# Patient Record
Sex: Female | Born: 1945 | Race: Black or African American | Hispanic: No
Health system: Southern US, Community
[De-identification: ages and names within clinical notes are randomized; demographics above are authoritative.]

---

## 2009-03-20 ENCOUNTER — Inpatient Hospital Stay (HOSPITAL_COMMUNITY): Admission: EM | Admit: 2009-03-20 | Discharge: 2009-03-25 | Payer: Self-pay | Admitting: Emergency Medicine

## 2009-03-23 ENCOUNTER — Encounter (INDEPENDENT_AMBULATORY_CARE_PROVIDER_SITE_OTHER): Payer: Self-pay | Admitting: Internal Medicine

## 2009-03-23 ENCOUNTER — Ambulatory Visit: Payer: Self-pay | Admitting: Physical Medicine & Rehabilitation

## 2009-03-25 ENCOUNTER — Ambulatory Visit: Payer: Self-pay | Admitting: Physical Medicine & Rehabilitation

## 2009-03-25 ENCOUNTER — Inpatient Hospital Stay (HOSPITAL_COMMUNITY)
Admission: RE | Admit: 2009-03-25 | Discharge: 2009-04-07 | Payer: Self-pay | Admitting: Physical Medicine & Rehabilitation

## 2009-05-19 ENCOUNTER — Encounter
Admission: RE | Admit: 2009-05-19 | Discharge: 2009-05-22 | Payer: Self-pay | Admitting: Physical Medicine & Rehabilitation

## 2009-05-22 ENCOUNTER — Ambulatory Visit: Payer: Self-pay | Admitting: Physical Medicine & Rehabilitation

## 2009-06-12 ENCOUNTER — Encounter
Admission: RE | Admit: 2009-06-12 | Discharge: 2009-07-31 | Payer: Self-pay | Admitting: Physical Medicine & Rehabilitation

## 2009-06-25 ENCOUNTER — Ambulatory Visit: Payer: Self-pay | Admitting: Internal Medicine

## 2009-06-25 DIAGNOSIS — I739 Peripheral vascular disease, unspecified: Secondary | ICD-10-CM | POA: Insufficient documentation

## 2009-06-25 DIAGNOSIS — I10 Essential (primary) hypertension: Secondary | ICD-10-CM | POA: Insufficient documentation

## 2009-06-25 DIAGNOSIS — F411 Generalized anxiety disorder: Secondary | ICD-10-CM | POA: Insufficient documentation

## 2009-06-25 DIAGNOSIS — Z8679 Personal history of other diseases of the circulatory system: Secondary | ICD-10-CM | POA: Insufficient documentation

## 2009-06-25 DIAGNOSIS — F329 Major depressive disorder, single episode, unspecified: Secondary | ICD-10-CM

## 2009-06-25 DIAGNOSIS — F068 Other specified mental disorders due to known physiological condition: Secondary | ICD-10-CM | POA: Insufficient documentation

## 2009-06-25 DIAGNOSIS — R5383 Other fatigue: Secondary | ICD-10-CM

## 2009-06-25 DIAGNOSIS — R5381 Other malaise: Secondary | ICD-10-CM

## 2009-06-25 DIAGNOSIS — F1021 Alcohol dependence, in remission: Secondary | ICD-10-CM

## 2009-06-25 DIAGNOSIS — E785 Hyperlipidemia, unspecified: Secondary | ICD-10-CM | POA: Insufficient documentation

## 2009-06-29 LAB — CONVERTED CEMR LAB
AST: 22 units/L (ref 0–37)
Albumin: 4 g/dL (ref 3.5–5.2)
Basophils Absolute: 0.1 10*3/uL (ref 0.0–0.1)
Bilirubin Urine: NEGATIVE
Cholesterol: 209 mg/dL — ABNORMAL HIGH (ref 0–200)
Creatinine, Ser: 0.7 mg/dL (ref 0.4–1.2)
Eosinophils Absolute: 0.1 10*3/uL (ref 0.0–0.7)
Folate: 5.6 ng/mL
GFR calc non Af Amer: 106.43 mL/min (ref >60)
HCT: 38.4 % (ref 36.0–46.0)
Hemoglobin: 12.9 g/dL (ref 12.0–15.0)
Iron: 60 ug/dL (ref 42–145)
Ketones, ur: NEGATIVE mg/dL
Lymphs Abs: 1.3 10*3/uL (ref 0.7–4.0)
Neutrophils Relative %: 60.1 % (ref 43.0–77.0)
Platelets: 282 10*3/uL (ref 150.0–400.0)
Potassium: 4 meq/L (ref 3.5–5.1)
RBC: 4.27 M/uL (ref 3.87–5.11)
RDW: 13.7 % (ref 11.5–14.6)
Saturation Ratios: 19.6 % — ABNORMAL LOW (ref 20.0–50.0)
Sodium: 140 meq/L (ref 135–145)
Total Bilirubin: 0.8 mg/dL (ref 0.3–1.2)
Total CHOL/HDL Ratio: 3
Transferrin: 218.2 mg/dL (ref 212.0–360.0)
Triglycerides: 51 mg/dL (ref 0.0–149.0)
Urine Glucose: NEGATIVE mg/dL
Urobilinogen, UA: 2 (ref 0.0–1.0)
WBC: 4.4 10*3/uL — ABNORMAL LOW (ref 4.5–10.5)

## 2009-07-07 ENCOUNTER — Emergency Department (HOSPITAL_COMMUNITY): Admission: EM | Admit: 2009-07-07 | Discharge: 2009-07-07 | Payer: Self-pay | Admitting: Emergency Medicine

## 2009-07-22 ENCOUNTER — Encounter
Admission: RE | Admit: 2009-07-22 | Discharge: 2009-07-28 | Payer: Self-pay | Admitting: Physical Medicine & Rehabilitation

## 2009-07-27 ENCOUNTER — Telehealth: Payer: Self-pay | Admitting: Internal Medicine

## 2009-07-28 ENCOUNTER — Ambulatory Visit: Payer: Self-pay | Admitting: Physical Medicine & Rehabilitation

## 2009-07-29 ENCOUNTER — Telehealth: Payer: Self-pay | Admitting: Internal Medicine

## 2009-07-29 ENCOUNTER — Ambulatory Visit: Payer: Self-pay | Admitting: Internal Medicine

## 2009-07-30 ENCOUNTER — Encounter: Payer: Self-pay | Admitting: Internal Medicine

## 2009-08-07 ENCOUNTER — Encounter: Payer: Self-pay | Admitting: Internal Medicine

## 2009-08-13 ENCOUNTER — Encounter: Payer: Self-pay | Admitting: Internal Medicine

## 2009-08-18 ENCOUNTER — Ambulatory Visit: Payer: Self-pay | Admitting: Internal Medicine

## 2009-09-17 ENCOUNTER — Encounter: Payer: Self-pay | Admitting: Internal Medicine

## 2009-10-10 ENCOUNTER — Emergency Department (HOSPITAL_COMMUNITY): Admission: EM | Admit: 2009-10-10 | Discharge: 2009-10-10 | Payer: Self-pay | Admitting: Emergency Medicine

## 2009-10-13 ENCOUNTER — Encounter: Payer: Self-pay | Admitting: Internal Medicine

## 2009-10-16 ENCOUNTER — Encounter
Admission: RE | Admit: 2009-10-16 | Discharge: 2009-10-16 | Payer: Self-pay | Source: Home / Self Care | Attending: Physical Medicine & Rehabilitation | Admitting: Physical Medicine & Rehabilitation

## 2009-10-23 ENCOUNTER — Ambulatory Visit (HOSPITAL_COMMUNITY): Admission: RE | Admit: 2009-10-23 | Discharge: 2009-10-23 | Payer: Self-pay | Admitting: Family Medicine

## 2010-02-28 ENCOUNTER — Encounter: Payer: Self-pay | Admitting: Physical Medicine & Rehabilitation

## 2010-03-09 NOTE — Miscellaneous (Signed)
Summary: Face to face encounter/[facility]  Face to face encounter/[facility]   Imported By: Sherian Rein 10/29/2009 09:44:07  _____________________________________________________________________  External Attachment:    Type:   Image     Comment:   External Document

## 2010-03-09 NOTE — Miscellaneous (Signed)
Summary: Orders / [facility]  Orders / [facility]   Imported By: Lennie Odor 08/19/2009 16:49:13  _____________________________________________________________________  External Attachment:    Type:   Image     Comment:   External Document

## 2010-03-09 NOTE — Miscellaneous (Signed)
Summary: Plan of Care & Treatment/[facility]  Plan of Care & Treatment/[facility]   Imported By: Sherian Rein 08/19/2009 14:44:55  _____________________________________________________________________  External Attachment:    Type:   Image     Comment:   External Document

## 2010-03-09 NOTE — Progress Notes (Signed)
Summary: Referral/ RX  Phone Note Call from Patient   Caller: Daughter Meriam Sprague [phone] Summary of Call: Pt's daughter called requesting a referralto have HH aid come to pt's home and assist pt with daily activities and care. Pt is also requesting Rx for Ensure supplements. Initial call taken by: Margaret Pyle, CMA,  July 27, 2009 9:46 AM  Follow-up for Phone Call        ok for [facility] - done per EMR  ok for ensure - three times a day between meals  done hardcopy to LIM side B - dahlia  Follow-up by: Corwin Levins MD,  July 27, 2009 12:36 PM  Additional Follow-up for Phone Call Additional follow up Details #1::        pt's daughter informed and Rx faxed to pharmacy per daughter request. Daughter will expect call from [facility] for [facility] referral Additional Follow-up by: Margaret Pyle, CMA,  July 27, 2009 1:31 PM    New/Updated Medications: ENSURE  LIQD (NUTRITIONAL SUPPLEMENTS) 1 can by mouth three times a day between meals Prescriptions: ENSURE  LIQD (NUTRITIONAL SUPPLEMENTS) 1 can by mouth three times a day between meals  #90 x 5   Entered and Authorized by:   Corwin Levins MD   Signed by:   Corwin Levins MD on 07/27/2009   Method used:   Print then Give to Patient   RxID:   [id number]

## 2010-03-09 NOTE — Miscellaneous (Signed)
Summary: Orders/[facility]  Orders/[facility]   Imported By: Lester Warrenton 08/13/2009 08:25:17  _____________________________________________________________________  External Attachment:    Type:   Image     Comment:   External Document

## 2010-03-09 NOTE — Letter (Signed)
Summary: Generic Letter  [facility]  [address] [place], [place] [address]   Phone: [phone]  Fax: [phone]    07/29/2009  Traci Tucker [address] [place], [place]  [address] DOB  05-15-1945  Dear [name],       This letter is in support of your [facility] bus  system application.   You have been diagnosed with:  Stroke, Dementia, and Peripheral Vascular Disease,  which are each moderate and permanent, and together   fully prevent the use of [place] regular bus system under   all circumstances, due to being unable to personally  perform the mental and physical requirements needed.      Sincerely,   Oliver Barre MD

## 2010-03-09 NOTE — Progress Notes (Signed)
  Phone Note From Other Clinic   Caller: [facility] Summary of Call: [facility] stating pts. oxygen 87, coughed up phlem, oxygen at 96.Irving Burton at [facility] requesting DME for 3 in 1 potty chair and raised toilet.  Call back number [phone]. Initial call taken by: Robin Ewing CMA Duncan Dull),  July 29, 2009 8:59 AM  Follow-up for Phone Call        done hardcopy to LIM side B - dahlia  Follow-up by: Corwin Levins MD,  July 29, 2009 9:05 AM  Additional Follow-up for Phone Call Additional follow up Details #1::        Rx faxed to [facility] [phone] Additional Follow-up by: Margaret Pyle, CMA,  July 29, 2009 9:07 AM    New/Updated Medications: * 3 IN 1 COMMODE use asd Prescriptions: 3 IN 1 COMMODE use asd  #1 x 0   Entered and Authorized by:   Corwin Levins MD   Signed by:   Corwin Levins MD on 07/29/2009   Method used:   Print then Give to Patient   RxID:   [id number]

## 2010-03-09 NOTE — Letter (Signed)
Summary: [facility]  [facility]   Imported By: Sherian Rein 07/31/2009 09:51:32  _____________________________________________________________________  External Attachment:    Type:   Image     Comment:   External Document

## 2010-03-09 NOTE — Miscellaneous (Signed)
Summary: Orders/[facility]  Orders/[facility]   Imported By: Lester Williamston 09/18/2009 10:26:34  _____________________________________________________________________  External Attachment:    Type:   Image     Comment:   External Document

## 2010-03-09 NOTE — Letter (Signed)
Summary: CMN for [employer]/[facility]  CMN for [employer]/[facility]   Imported By: Sherian Rein 08/17/2009 13:48:31  _____________________________________________________________________  External Attachment:    Type:   Image     Comment:   External Document

## 2010-03-09 NOTE — Miscellaneous (Signed)
Summary: Face to Face Encounter/[facility]  Face to Face Encounter/[facility]   Imported By: Sherian Rein 08/19/2009 14:46:17  _____________________________________________________________________  External Attachment:    Type:   Image     Comment:   External Document

## 2010-03-09 NOTE — Letter (Signed)
Summary: Exam form/[facility]  Exam form/[facility]   Imported By: Sherian Rein 07/31/2009 09:58:28  _____________________________________________________________________  External Attachment:    Type:   Image     Comment:   External Document

## 2010-03-09 NOTE — Miscellaneous (Signed)
Summary: Face to Face Encounter / [facility]  Face to Face Encounter / [facility]   Imported By: Lennie Odor 08/26/2009 10:41:07  _____________________________________________________________________  External Attachment:    Type:   Image     Comment:   External Document

## 2010-03-09 NOTE — Assessment & Plan Note (Signed)
Summary: NEW MEDICARE PT  PKG # -PER DIANE /DR. KIRSTEINS- [facility]   Vital Signs:  Patient profile:   65 year old female Height:      62 inches Weight:      104 pounds BMI:     19.09 O2 Sat:      96 % on Room air Temp:     97.2 degrees F oral Pulse rate:   66 / minute BP sitting:   120 / 80  (left arm) Cuff size:   regular  Vitals Entered ByZella Ball Ewing (Jul 09, 2009 9:31 AM)  O2 Flow:  Room air  Preventive Care Screening     decliens colonoscopy or dxa  CC: New Pt. New Medicare/RE   CC:  New Pt. New Medicare/RE.  History of Present Illness: here s/p recent stroke; going to outpt rehab center currently for OT, to start PT , and speech therapy to start next wk;  overall doing well;  Pt denies CP, sob, doe, wheezing, orthopnea, pnd, worsening LE edema, palps, dizziness or syncope   Pt denies new neuro symptoms such as headache, facial or extremity weakness . Trying to follow low chol diet, good complaince with meds and tolerates well, including the statin.  Here for wellness Diet: Heart Healthy or DM if diabetic Physical Activities: Sedentary, can climb steps, no pain, and does not drive Depression/mood screen: mild to mod, no suicidal ideation Hearing: Intact bilateral Visual Acuity: Grossly normal, gets exam yearly - but due soon ADL's: Capable  - mostly independent with self care but requires some help; duaghter lives with her to help; walks without cane Fall Risk: mild Home Safety: Good - just got raised toilet Cognitive Impairment:  Gen appearance,, memory, attention & motor skills grossly intact except for the depressive demeanor, dysarthria, and motor defecit related to the recent stroke End-of-Life Planning: Advance directive - Full code/I agree  but no prolonged ventilation   Preventive Screening-Counseling & Management  Alcohol-Tobacco     Smoking Status: current      Drug Use:  no.    Problems Prior to Update: 1)  Fatigue  (ICD-780.79) 2)  Dementia   (ICD-294.8) 3)  Peripheral Vascular Disease  (ICD-443.9) 4)  Hypertension  (ICD-401.9) 5)  Hyperlipidemia  (ICD-272.4) 6)  Cerebrovascular Accident, Hx of  (ICD-V12.50) 7)  Anxiety  (ICD-300.00) 8)  Depression  (ICD-311) 9)  Alcohol Abuse, Hx of  (ICD-V11.3)  Medications Prior to Update: 1)  None  Current Medications (verified): 1)  Lisinopril 10 Mg Tabs (Lisinopril) .Marland Kitchen.. 1 By Mouth Once Daily 2)  Lovastatin 10 Mg Tabs (Lovastatin) .Marland Kitchen.. 1 By Mouth Once Daily 3)  Citalopram Hydrobromide 10 Mg Tabs (Citalopram Hydrobromide) .Marland Kitchen.. 1po Once Daily 4)  Ecotrin 325 Mg Tbec (Aspirin) .Marland Kitchen.. 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 stroke mother died with heart disease MI  Social History: Reviewed history and no changes required. 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  The patient denies fever, weight gain, vision loss, decreased hearing, hoarseness, chest pain, syncope, dyspnea on exertion, peripheral edema, prolonged cough, headaches, hemoptysis, abdominal pain, melena, hematochezia, severe indigestion/heartburn, hematuria, muscle weakness, suspicious skin lesions, transient blindness, abnormal bleeding, enlarged lymph nodes, angioedema, and breast masses.         all otherwise negative per pt -  excpet for mild ST memory impairment as well , and general fatigue  Physical Exam  General:  alert and underweight appearing.   Head:  normocephalic and atraumatic.   Eyes:  vision grossly intact, pupils equal, and pupils round.   Ears:  R ear normal and L ear normal.   Nose:  no external deformity and no nasal discharge.   Mouth:  no gingival abnormalities and pharynx pink and moist.   Neck:  supple and no masses.   Lungs:  normal respiratory effort and normal breath sounds.   Heart:  normal rate and regular rhythm.   Abdomen:  soft, non-tender, and normal bowel sounds.   Msk:  no joint tenderness and no joint swelling.   Extremities:  no edema, no erythema  Neurologic:  cranial nerves II-XII intact and strength normal in all extremities.     Impression & Recommendations:  Problem # 1:  Preventive Health Care (ICD-V70.0)  Overall doing well, age appropriate education and counseling updated and referral for appropriate preventive services done unless declined, immunizations up to date or declined, diet counseling done if overweight, urged to quit smoking if smokes , most recent labs reviewed and current ordered if appropriate, ecg reviewed or declined (interpretation per ECG scanned in the EMR if done); information regarding Medicare Prevention requirements given if appropriate; speciality referrals updated as appropriate    Orders: First annual wellness visit with prevention plan  (Z6109)  Problem # 2:  FATIGUE (ICD-780.79) exam benign, to check labs below; follow with expectant management  Orders: T-Vitamin D (25-Hydroxy) (60454-09811) TLB-BMP (Basic Metabolic Panel-BMET) (80048-METABOL) TLB-CBC Platelet - w/Differential (85025-CBCD) TLB-Hepatic/Liver Function Pnl (80076-HEPATIC) TLB-TSH (Thyroid Stimulating Hormone) (84443-TSH) TLB-Sedimentation Rate (ESR) (85652-ESR) TLB-IBC Pnl (Iron/FE;Transferrin) (83550-IBC) TLB-B12 + Folate Pnl (82746_82607-B12/FOL) TLB-Udip ONLY (81003-UDIP)  Problem # 3:  DEPRESSION (ICD-311)  Her updated medication list for this problem includes:    Citalopram Hydrobromide 10 Mg Tabs (Citalopram hydrobromide) .Marland Kitchen... 1po once daily treat as above, f/u any worsening signs or symptoms   Problem # 4:  HYPERTENSION (ICD-401.9)  Her updated medication list for this problem includes:    Lisinopril 10 Mg Tabs (Lisinopril) .Marland Kitchen... 1 by mouth once daily  BP today: 120/80 treat as above, f/u any worsening signs or symptoms ; stable overall by hx and exam, ok to continue meds/tx as is   Orders: Prescription Created Electronically [phone])  Problem # 5:  HYPERLIPIDEMIA (ICD-272.4)  Her updated medication  list for this problem includes:    Lovastatin 10 Mg Tabs (Lovastatin) .Marland Kitchen... 1 by mouth once daily  Orders: TLB-Lipid Panel (80061-LIPID) Pt to continue diet efforts, good med tolerance; to check labs - goal LDL less than 70   Complete Medication List: 1)  Lisinopril 10 Mg Tabs (Lisinopril) .Marland Kitchen.. 1 by mouth once daily 2)  Lovastatin 10 Mg Tabs (Lovastatin) .Marland Kitchen.. 1 by mouth once daily 3)  Citalopram Hydrobromide 10 Mg Tabs (Citalopram hydrobromide) .Marland Kitchen.. 1po once daily 4)  Ecotrin 325 Mg Tbec (Aspirin) .Marland Kitchen.. 1po once daily  Other Orders: TD Toxoids IM 7 YR + (65784) Pneumococcal Vaccine (69629) Admin 1st Vaccine (52841) Admin of Any Addtl Vaccine (32440)  Patient  Instructions: 1)  you had the tetanus and pneumonia shots today 2)  please call for your yearly mammogram - consider [facility] on [address], or [facility] on [address] 3)  please make appt with opthomology - [facility] 4)  start the citalopram at 10 mg per day 5)  Continue all previous medications as before this visit  6)  Please go to the Lab in the basement for your blood and/or urine tests today 7)  Please schedule a follow-up appointment in 6 months, or sooner if needed Prescriptions: LISINOPRIL 10 MG TABS (LISINOPRIL) 1 by mouth once daily  #90 x 3   Entered and Authorized by:   Corwin Levins MD   Signed by:   Corwin Levins MD on 06/25/2009   Method used:   Print then Give to Patient   RxID:   [id number] LOVASTATIN 10 MG TABS (LOVASTATIN) 1 by mouth once daily  #90 x 3   Entered and Authorized by:   Corwin Levins MD   Signed by:   Corwin Levins MD on 06/25/2009   Method used:   Print then Give to Patient   RxID:   [id number] CITALOPRAM HYDROBROMIDE 10 MG TABS (CITALOPRAM HYDROBROMIDE) 1po once daily  #90 x 3   Entered and Authorized by:   Corwin Levins MD   Signed by:   Corwin Levins MD on 06/25/2009   Method used:   Print then Give to Patient   RxID:   [id number]    Immunizations Administered:  Tetanus Vaccine:    Vaccine Type: Td    Site: right deltoid    Mfr: Sanofi Pasteur    Dose: 0.5 ml    Route: IM    Given by: Zella Ball Ewing    Exp. Date: 02/20/2011    Lot #: [id number]    VIS given: 12/26/06 version given Jun 25, 2009.  Pneumonia Vaccine:    Vaccine Type: Pneumovax    Site: left deltoid    Mfr: Merck    Dose: 0.5 ml    Route: IM    Given by: Robin Ewing    Exp. Date: 09/19/2010    Lot #: [id number]    VIS given: 09/05/95 version given Jun 25, 2009.

## 2010-04-21 ENCOUNTER — Emergency Department (HOSPITAL_COMMUNITY)
Admission: EM | Admit: 2010-04-21 | Discharge: 2010-04-21 | Disposition: A | Discharge status: Home / Self Care | Payer: Medicare (Managed Care) | Attending: Emergency Medicine | Admitting: Emergency Medicine

## 2010-04-21 ENCOUNTER — Emergency Department (HOSPITAL_COMMUNITY): Payer: Medicare (Managed Care)

## 2010-04-21 DIAGNOSIS — R32 Unspecified urinary incontinence: Secondary | ICD-10-CM | POA: Insufficient documentation

## 2010-04-21 DIAGNOSIS — Z79899 Other long term (current) drug therapy: Secondary | ICD-10-CM | POA: Insufficient documentation

## 2010-04-21 DIAGNOSIS — F29 Unspecified psychosis not due to a substance or known physiological condition: Secondary | ICD-10-CM | POA: Insufficient documentation

## 2010-04-21 DIAGNOSIS — I10 Essential (primary) hypertension: Secondary | ICD-10-CM | POA: Insufficient documentation

## 2010-04-21 DIAGNOSIS — R55 Syncope and collapse: Secondary | ICD-10-CM | POA: Insufficient documentation

## 2010-04-21 DIAGNOSIS — R159 Full incontinence of feces: Secondary | ICD-10-CM | POA: Insufficient documentation

## 2010-04-21 DIAGNOSIS — R404 Transient alteration of awareness: Secondary | ICD-10-CM | POA: Insufficient documentation

## 2010-04-21 LAB — COMPREHENSIVE METABOLIC PANEL
ALT: 32 U/L (ref 0–35)
Albumin: 3.8 g/dL (ref 3.5–5.2)
Alkaline Phosphatase: 56 U/L (ref 39–117)
CO2: 30 mEq/L (ref 19–32)
Chloride: 105 mEq/L (ref 96–112)
Creatinine, Ser: 0.86 mg/dL (ref 0.4–1.2)
GFR calc Af Amer: >60 mL/min (ref >60)
Total Protein: 7.2 g/dL (ref 6.0–8.3)

## 2010-04-21 LAB — DIFFERENTIAL
Lymphocytes Relative: 28 % (ref 12–46)
Lymphs Abs: 1.6 10*3/uL (ref 0.7–4.0)
Monocytes Absolute: 0.4 10*3/uL (ref 0.1–1.0)
Monocytes Relative: 7 % (ref 3–12)
Neutro Abs: 3.3 10*3/uL (ref 1.7–7.7)
Neutrophils Relative %: 61 % (ref 43–77)

## 2010-04-21 LAB — URINE MICROSCOPIC-ADD ON

## 2010-04-21 LAB — URINALYSIS, ROUTINE W REFLEX MICROSCOPIC
Glucose, UA: NEGATIVE mg/dL
Hgb urine dipstick: NEGATIVE
Ketones, ur: NEGATIVE mg/dL
Specific Gravity, Urine: 1.022 (ref 1.005–1.030)
Urobilinogen, UA: 1 mg/dL (ref 0.0–1.0)

## 2010-04-21 LAB — CBC
MCH: 29.8 pg (ref 26.0–34.0)
MCHC: 33.6 g/dL (ref 30.0–36.0)
MCV: 88.8 fL (ref 78.0–100.0)
Platelets: 202 10*3/uL (ref 150–400)
RBC: 4.29 MIL/uL (ref 3.87–5.11)

## 2010-04-21 LAB — POCT CARDIAC MARKERS
CKMB, poc: 1.2 ng/mL (ref 1.0–8.0)
Troponin i, poc: <0.05 ng/mL (ref 0.00–0.09)

## 2010-04-22 LAB — BASIC METABOLIC PANEL
BUN: 8 mg/dL (ref 6–23)
CO2: 26 mEq/L (ref 19–32)
Chloride: 106 mEq/L (ref 96–112)
Creatinine, Ser: 0.66 mg/dL (ref 0.4–1.2)
GFR calc Af Amer: >60 mL/min (ref >60)

## 2010-04-22 LAB — CBC
HCT: 35 % — ABNORMAL LOW (ref 36.0–46.0)
Hemoglobin: 11.7 g/dL — ABNORMAL LOW (ref 12.0–15.0)
MCH: 28.7 pg (ref 26.0–34.0)
MCV: 85.6 fL (ref 78.0–100.0)
Platelets: 15 10*3/uL — CL (ref 150–400)
RBC: 3.55 MIL/uL — ABNORMAL LOW (ref 3.87–5.11)
RBC: 4.03 MIL/uL (ref 3.87–5.11)
WBC: 3.9 10*3/uL — ABNORMAL LOW (ref 4.0–10.5)

## 2010-04-22 LAB — URINALYSIS, ROUTINE W REFLEX MICROSCOPIC
Bilirubin Urine: NEGATIVE
Ketones, ur: NEGATIVE mg/dL
Nitrite: NEGATIVE
Urobilinogen, UA: 1 mg/dL (ref 0.0–1.0)

## 2010-04-22 LAB — URINE CULTURE: Colony Count: NO GROWTH

## 2010-04-22 LAB — DIFFERENTIAL
Basophils Relative: 1 % (ref 0–1)
Eosinophils Relative: 3 % (ref 0–5)
Lymphocytes Relative: 38 % (ref 12–46)
Lymphs Abs: 1.2 10*3/uL (ref 0.7–4.0)
Lymphs Abs: 1.5 10*3/uL (ref 0.7–4.0)
Monocytes Absolute: 0.1 10*3/uL (ref 0.1–1.0)
Monocytes Absolute: 0.2 10*3/uL (ref 0.1–1.0)
Monocytes Relative: 6 % (ref 3–12)
Neutro Abs: 1 10*3/uL — ABNORMAL LOW (ref 1.7–7.7)
Neutro Abs: 2 10*3/uL (ref 1.7–7.7)

## 2010-04-22 LAB — HEMOCCULT GUIAC POC 1CARD (OFFICE): Fecal Occult Bld: NEGATIVE

## 2010-04-22 LAB — GLUCOSE, CAPILLARY: Glucose-Capillary: 91 mg/dL (ref 70–99)

## 2010-04-22 LAB — POCT CARDIAC MARKERS

## 2010-04-26 LAB — DIFFERENTIAL
Basophils Absolute: 0 10*3/uL (ref 0.0–0.1)
Basophils Relative: 0 % (ref 0–1)
Eosinophils Relative: 1 % (ref 0–5)
Monocytes Absolute: 0.4 10*3/uL (ref 0.1–1.0)
Monocytes Relative: 3 % (ref 3–12)

## 2010-04-26 LAB — CBC
HCT: 35.4 % — ABNORMAL LOW (ref 36.0–46.0)
Hemoglobin: 12 g/dL (ref 12.0–15.0)
MCHC: 33.8 g/dL (ref 30.0–36.0)
RBC: 3.91 MIL/uL (ref 3.87–5.11)
RDW: 13.7 % (ref 11.5–15.5)

## 2010-04-26 LAB — POCT I-STAT, CHEM 8
BUN: 14 mg/dL (ref 6–23)
Calcium, Ion: 1.11 mmol/L — ABNORMAL LOW (ref 1.12–1.32)
Glucose, Bld: 110 mg/dL — ABNORMAL HIGH (ref 70–99)
HCT: 37 % (ref 36.0–46.0)
TCO2: 24 mmol/L (ref 0–100)

## 2010-04-26 LAB — POCT CARDIAC MARKERS: Troponin i, poc: <0.05 ng/mL (ref 0.00–0.09)

## 2010-04-29 LAB — URINALYSIS, ROUTINE W REFLEX MICROSCOPIC
Bilirubin Urine: NEGATIVE
Glucose, UA: NEGATIVE mg/dL
Hgb urine dipstick: NEGATIVE
Nitrite: NEGATIVE
Nitrite: NEGATIVE
Protein, ur: NEGATIVE mg/dL
Specific Gravity, Urine: 1.008 (ref 1.005–1.030)
Specific Gravity, Urine: 1.029 (ref 1.005–1.030)
Urobilinogen, UA: 0.2 mg/dL (ref 0.0–1.0)
pH: 7 (ref 5.0–8.0)

## 2010-04-29 LAB — PROTIME-INR
INR: 1.14 (ref 0.00–1.49)
Prothrombin Time: 14.1 seconds (ref 11.6–15.2)
Prothrombin Time: 14.5 seconds (ref 11.6–15.2)

## 2010-04-29 LAB — GLUCOSE, CAPILLARY
Glucose-Capillary: 101 mg/dL — ABNORMAL HIGH (ref 70–99)
Glucose-Capillary: 102 mg/dL — ABNORMAL HIGH (ref 70–99)
Glucose-Capillary: 107 mg/dL — ABNORMAL HIGH (ref 70–99)
Glucose-Capillary: 109 mg/dL — ABNORMAL HIGH (ref 70–99)
Glucose-Capillary: 112 mg/dL — ABNORMAL HIGH (ref 70–99)
Glucose-Capillary: 114 mg/dL — ABNORMAL HIGH (ref 70–99)
Glucose-Capillary: 114 mg/dL — ABNORMAL HIGH (ref 70–99)
Glucose-Capillary: 118 mg/dL — ABNORMAL HIGH (ref 70–99)
Glucose-Capillary: 120 mg/dL — ABNORMAL HIGH (ref 70–99)
Glucose-Capillary: 121 mg/dL — ABNORMAL HIGH (ref 70–99)
Glucose-Capillary: 126 mg/dL — ABNORMAL HIGH (ref 70–99)
Glucose-Capillary: 127 mg/dL — ABNORMAL HIGH (ref 70–99)
Glucose-Capillary: 130 mg/dL — ABNORMAL HIGH (ref 70–99)
Glucose-Capillary: 131 mg/dL — ABNORMAL HIGH (ref 70–99)
Glucose-Capillary: 133 mg/dL — ABNORMAL HIGH (ref 70–99)
Glucose-Capillary: 135 mg/dL — ABNORMAL HIGH (ref 70–99)
Glucose-Capillary: 156 mg/dL — ABNORMAL HIGH (ref 70–99)
Glucose-Capillary: 84 mg/dL (ref 70–99)
Glucose-Capillary: 88 mg/dL (ref 70–99)
Glucose-Capillary: 89 mg/dL (ref 70–99)
Glucose-Capillary: 92 mg/dL (ref 70–99)
Glucose-Capillary: 92 mg/dL (ref 70–99)
Glucose-Capillary: 96 mg/dL (ref 70–99)

## 2010-04-29 LAB — BASIC METABOLIC PANEL
BUN: 4 mg/dL — ABNORMAL LOW (ref 6–23)
BUN: 5 mg/dL — ABNORMAL LOW (ref 6–23)
BUN: 8 mg/dL (ref 6–23)
CO2: 27 mEq/L (ref 19–32)
CO2: 27 mEq/L (ref 19–32)
CO2: 28 mEq/L (ref 19–32)
Calcium: 9.3 mg/dL (ref 8.4–10.5)
Chloride: 104 mEq/L (ref 96–112)
Chloride: 105 mEq/L (ref 96–112)
Chloride: 108 mEq/L (ref 96–112)
Chloride: 108 mEq/L (ref 96–112)
Chloride: 108 mEq/L (ref 96–112)
Chloride: 110 mEq/L (ref 96–112)
Creatinine, Ser: 0.64 mg/dL (ref 0.4–1.2)
GFR calc Af Amer: >60 mL/min (ref >60)
GFR calc Af Amer: >60 mL/min (ref >60)
GFR calc Af Amer: >60 mL/min (ref >60)
GFR calc non Af Amer: >60 mL/min (ref >60)
GFR calc non Af Amer: >60 mL/min (ref >60)
GFR calc non Af Amer: >60 mL/min (ref >60)
GFR calc non Af Amer: >60 mL/min (ref >60)
Glucose, Bld: 120 mg/dL — ABNORMAL HIGH (ref 70–99)
Glucose, Bld: 93 mg/dL (ref 70–99)
Potassium: 3.5 mEq/L (ref 3.5–5.1)
Potassium: 3.5 mEq/L (ref 3.5–5.1)
Potassium: 3.7 mEq/L (ref 3.5–5.1)
Potassium: 4.1 mEq/L (ref 3.5–5.1)
Potassium: 4.2 mEq/L (ref 3.5–5.1)
Potassium: 4.4 mEq/L (ref 3.5–5.1)
Sodium: 137 mEq/L (ref 135–145)
Sodium: 138 mEq/L (ref 135–145)
Sodium: 138 mEq/L (ref 135–145)
Sodium: 141 mEq/L (ref 135–145)
Sodium: 141 mEq/L (ref 135–145)
Sodium: 142 mEq/L (ref 135–145)

## 2010-04-29 LAB — CBC
HCT: 36.1 % (ref 36.0–46.0)
HCT: 37.2 % (ref 36.0–46.0)
HCT: 37.2 % (ref 36.0–46.0)
HCT: 40.9 % (ref 36.0–46.0)
Hemoglobin: 12.9 g/dL (ref 12.0–15.0)
Hemoglobin: 12.9 g/dL (ref 12.0–15.0)
Hemoglobin: 13.5 g/dL (ref 12.0–15.0)
Hemoglobin: 13.9 g/dL (ref 12.0–15.0)
MCHC: 34.4 g/dL (ref 30.0–36.0)
MCHC: 34.5 g/dL (ref 30.0–36.0)
MCHC: 34.7 g/dL (ref 30.0–36.0)
MCHC: 34.9 g/dL (ref 30.0–36.0)
MCV: 90.7 fL (ref 78.0–100.0)
MCV: 90.8 fL (ref 78.0–100.0)
MCV: 90.8 fL (ref 78.0–100.0)
MCV: 91.3 fL (ref 78.0–100.0)
MCV: 91.7 fL (ref 78.0–100.0)
Platelets: 176 10*3/uL (ref 150–400)
Platelets: 183 10*3/uL (ref 150–400)
Platelets: 202 10*3/uL (ref 150–400)
Platelets: 216 10*3/uL (ref 150–400)
RBC: 3.96 MIL/uL (ref 3.87–5.11)
RBC: 4.1 MIL/uL (ref 3.87–5.11)
RBC: 4.1 MIL/uL (ref 3.87–5.11)
RDW: 13.2 % (ref 11.5–15.5)
RDW: 13.3 % (ref 11.5–15.5)
WBC: 4.7 10*3/uL (ref 4.0–10.5)
WBC: 6.9 10*3/uL (ref 4.0–10.5)

## 2010-04-29 LAB — COMPREHENSIVE METABOLIC PANEL
ALT: 23 U/L (ref 0–35)
AST: 19 U/L (ref 0–37)
AST: 22 U/L (ref 0–37)
Albumin: 3.4 g/dL — ABNORMAL LOW (ref 3.5–5.2)
Albumin: 4.6 g/dL (ref 3.5–5.2)
Alkaline Phosphatase: 56 U/L (ref 39–117)
BUN: 10 mg/dL (ref 6–23)
CO2: 28 mEq/L (ref 19–32)
Calcium: 8.7 mg/dL (ref 8.4–10.5)
Chloride: 105 mEq/L (ref 96–112)
Chloride: 107 mEq/L (ref 96–112)
Creatinine, Ser: 0.63 mg/dL (ref 0.4–1.2)
Creatinine, Ser: 0.91 mg/dL (ref 0.4–1.2)
GFR calc Af Amer: >60 mL/min (ref >60)
GFR calc Af Amer: >60 mL/min (ref >60)
GFR calc non Af Amer: >60 mL/min (ref >60)
Potassium: 4.5 mEq/L (ref 3.5–5.1)
Total Bilirubin: 1.1 mg/dL (ref 0.3–1.2)
Total Protein: 6.3 g/dL (ref 6.0–8.3)

## 2010-04-29 LAB — LIPID PANEL
Cholesterol: 256 mg/dL — ABNORMAL HIGH (ref 0–200)
LDL Cholesterol: 178 mg/dL — ABNORMAL HIGH (ref 0–99)
Triglycerides: 52 mg/dL (ref <150)

## 2010-04-29 LAB — POCT I-STAT, CHEM 8
BUN: 15 mg/dL (ref 6–23)
Calcium, Ion: 1.21 mmol/L (ref 1.12–1.32)
Chloride: 110 mEq/L (ref 96–112)
Creatinine, Ser: 1 mg/dL (ref 0.4–1.2)
TCO2: 28 mmol/L (ref 0–100)

## 2010-04-29 LAB — TROPONIN I: Troponin I: 0.01 ng/mL (ref 0.00–0.06)

## 2010-04-29 LAB — DIFFERENTIAL
Basophils Absolute: 0 10*3/uL (ref 0.0–0.1)
Basophils Relative: 0 % (ref 0–1)
Eosinophils Absolute: 0 10*3/uL (ref 0.0–0.7)
Eosinophils Relative: 0 % (ref 0–5)
Eosinophils Relative: 4 % (ref 0–5)
Lymphocytes Relative: 13 % (ref 12–46)
Lymphocytes Relative: 28 % (ref 12–46)
Lymphs Abs: 1.3 10*3/uL (ref 0.7–4.0)
Monocytes Absolute: 0.2 10*3/uL (ref 0.1–1.0)
Monocytes Absolute: 0.4 10*3/uL (ref 0.1–1.0)
Monocytes Relative: 9 % (ref 3–12)
Neutro Abs: 2.7 10*3/uL (ref 1.7–7.7)

## 2010-04-29 LAB — RAPID URINE DRUG SCREEN, HOSP PERFORMED
Amphetamines: NOT DETECTED
Barbiturates: NOT DETECTED
Tetrahydrocannabinol: NOT DETECTED

## 2010-04-29 LAB — URINE MICROSCOPIC-ADD ON

## 2010-04-29 LAB — HEMOGLOBIN A1C: Hgb A1c MFr Bld: 5.6 % (ref 4.6–6.1)

## 2010-04-29 LAB — POCT CARDIAC MARKERS: Troponin i, poc: <0.05 ng/mL (ref 0.00–0.09)

## 2010-04-29 LAB — CK TOTAL AND CKMB (NOT AT ARMC): Relative Index: 1.8 (ref 0.0–2.5)

## 2010-12-11 ENCOUNTER — Emergency Department (HOSPITAL_COMMUNITY)
Admission: EM | Admit: 2010-12-11 | Discharge: 2010-12-11 | Disposition: A | Discharge status: Home / Self Care | Payer: Medicare (Managed Care) | Attending: Emergency Medicine | Admitting: Emergency Medicine

## 2010-12-11 ENCOUNTER — Emergency Department (HOSPITAL_COMMUNITY): Payer: Medicare (Managed Care)

## 2010-12-11 DIAGNOSIS — Z79899 Other long term (current) drug therapy: Secondary | ICD-10-CM | POA: Insufficient documentation

## 2010-12-11 DIAGNOSIS — R42 Dizziness and giddiness: Secondary | ICD-10-CM | POA: Insufficient documentation

## 2010-12-11 DIAGNOSIS — R55 Syncope and collapse: Secondary | ICD-10-CM | POA: Insufficient documentation

## 2010-12-11 DIAGNOSIS — Z7982 Long term (current) use of aspirin: Secondary | ICD-10-CM | POA: Insufficient documentation

## 2010-12-11 DIAGNOSIS — Z8673 Personal history of transient ischemic attack (TIA), and cerebral infarction without residual deficits: Secondary | ICD-10-CM | POA: Insufficient documentation

## 2010-12-11 DIAGNOSIS — R159 Full incontinence of feces: Secondary | ICD-10-CM | POA: Insufficient documentation

## 2010-12-11 LAB — CBC
Hemoglobin: 11.5 g/dL — ABNORMAL LOW (ref 12.0–15.0)
MCV: 88.4 fL (ref 78.0–100.0)
Platelets: 163 10*3/uL (ref 150–400)
RBC: 3.87 MIL/uL (ref 3.87–5.11)
WBC: 5.3 10*3/uL (ref 4.0–10.5)

## 2010-12-11 LAB — DIFFERENTIAL
Eosinophils Absolute: 0.1 10*3/uL (ref 0.0–0.7)
Lymphocytes Relative: 26 % (ref 12–46)
Lymphs Abs: 1.4 10*3/uL (ref 0.7–4.0)
Neutro Abs: 3.4 10*3/uL (ref 1.7–7.7)
Neutrophils Relative %: 63 % (ref 43–77)

## 2010-12-11 LAB — URINALYSIS, ROUTINE W REFLEX MICROSCOPIC
Glucose, UA: NEGATIVE mg/dL
Hgb urine dipstick: NEGATIVE
Specific Gravity, Urine: 1.02 (ref 1.005–1.030)

## 2010-12-11 LAB — COMPREHENSIVE METABOLIC PANEL
ALT: 33 U/L (ref 0–35)
AST: 27 U/L (ref 0–37)
CO2: 30 mEq/L (ref 19–32)
Chloride: 100 mEq/L (ref 96–112)
Creatinine, Ser: 1.01 mg/dL (ref 0.50–1.10)
GFR calc non Af Amer: 57 mL/min — ABNORMAL LOW (ref >90)
Glucose, Bld: 86 mg/dL (ref 70–99)
Total Bilirubin: 0.6 mg/dL (ref 0.3–1.2)

## 2010-12-11 LAB — CK TOTAL AND CKMB (NOT AT ARMC): CK, MB: 2.1 ng/mL (ref 0.3–4.0)

## 2010-12-12 LAB — URINE CULTURE: Culture: NO GROWTH

## 2010-12-16 ENCOUNTER — Other Ambulatory Visit: Payer: Self-pay | Admitting: Family Medicine

## 2010-12-16 ENCOUNTER — Ambulatory Visit
Admission: RE | Admit: 2010-12-16 | Discharge: 2010-12-16 | Disposition: A | Discharge status: Home / Self Care | Payer: Medicare (Managed Care) | Source: Ambulatory Visit | Attending: Family Medicine | Admitting: Family Medicine

## 2010-12-16 DIAGNOSIS — M79652 Pain in left thigh: Secondary | ICD-10-CM

## 2011-09-25 IMAGING — CT CT HEAD W/O CM
1 series · 16 of 30 positions shown, 20 images · non-contrast
Comparison: Head CT scan 10/10/2009.

CLINICAL DATA: Temporary loss of consciousness.

CT HEAD WITHOUT CONTRAST
TECHNIQUE: Contiguous axial images were obtained from the base of
the skull through the vertex without contrast.

[Series 2: head routine 4.8 h37s · axial · 0.45mm/px · z∈[+1206,+1336]mm · 16 of 30 slices shown, 20 images]
[im 2/30  brain]
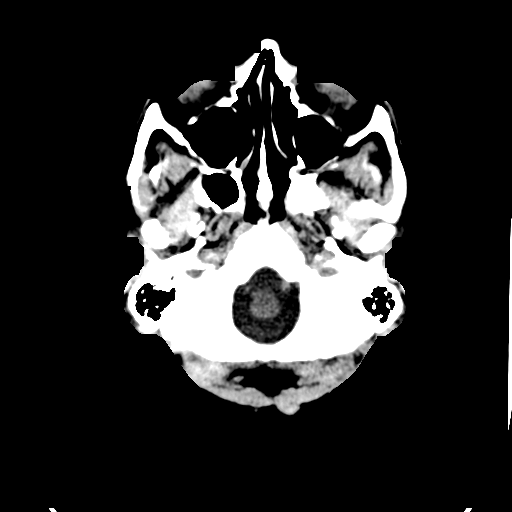
[im 2/30  bone]
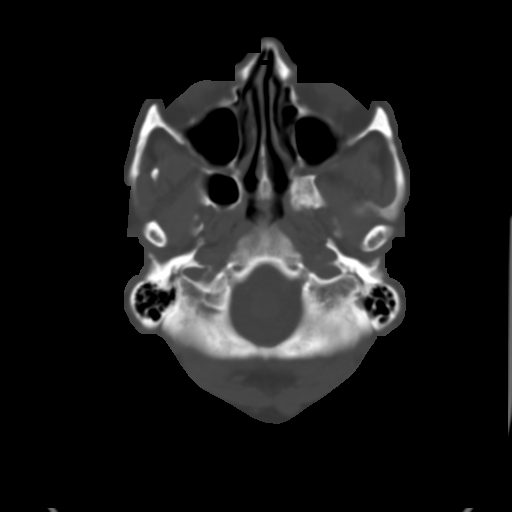
[im 4/30  brain]
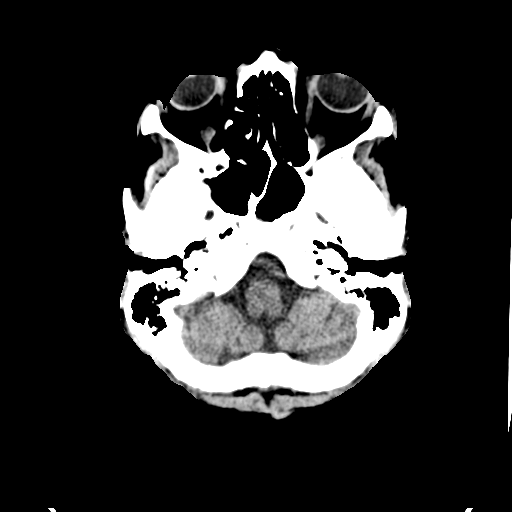
[im 6/30  brain]
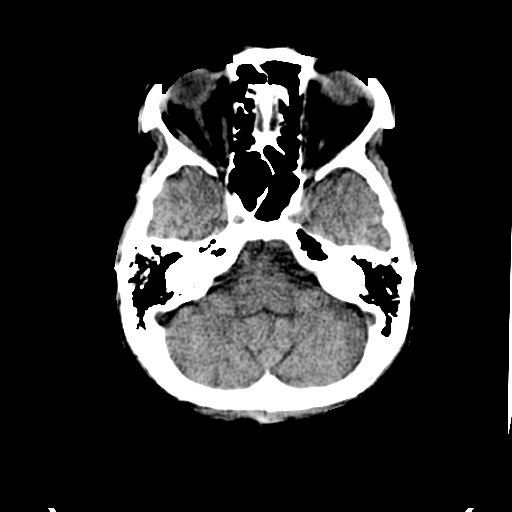
[im 8/30  brain]
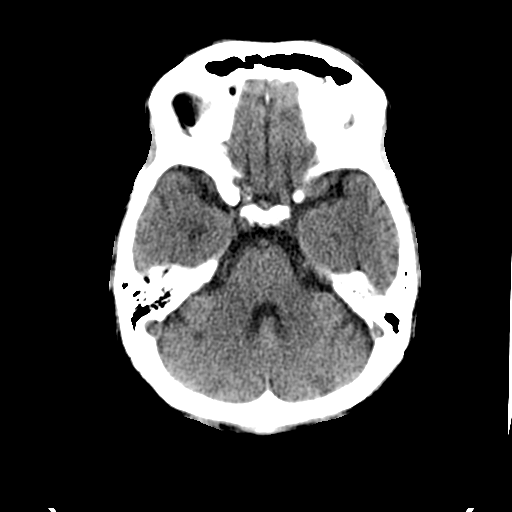
[im 9/30  brain]
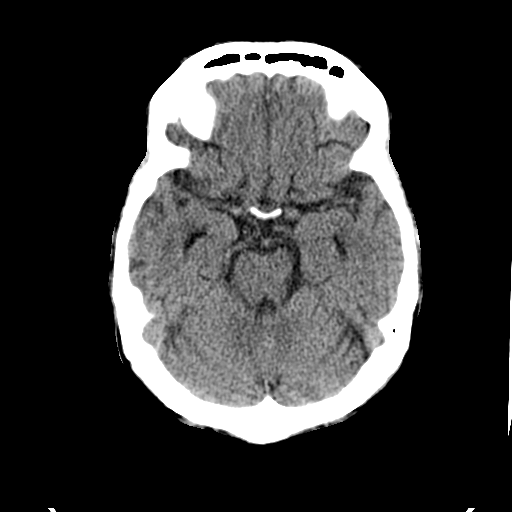
[im 9/30  bone]
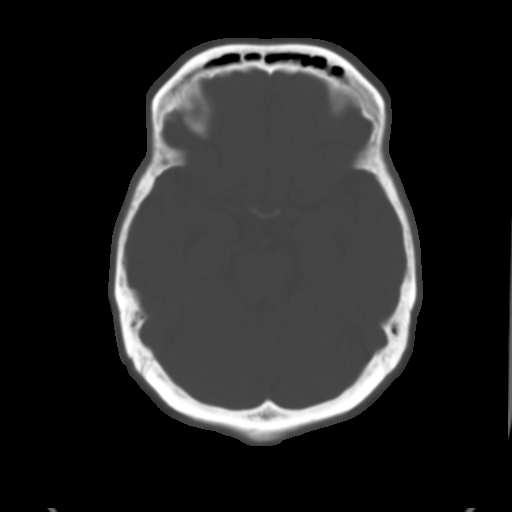
[im 11/30  brain]
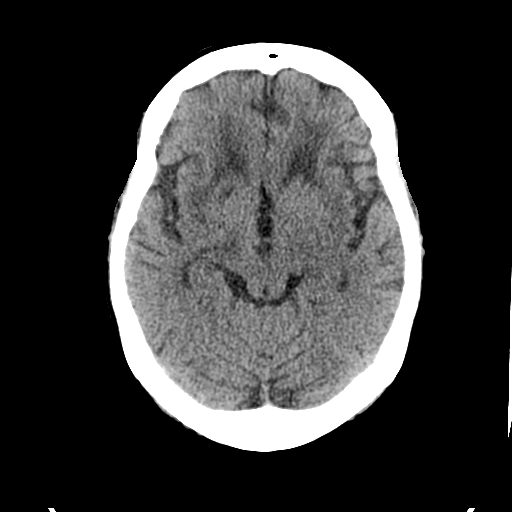
[im 13/30  brain]
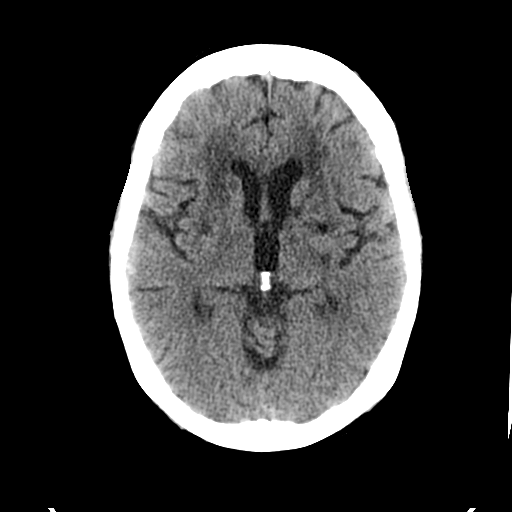
[im 15/30  brain]
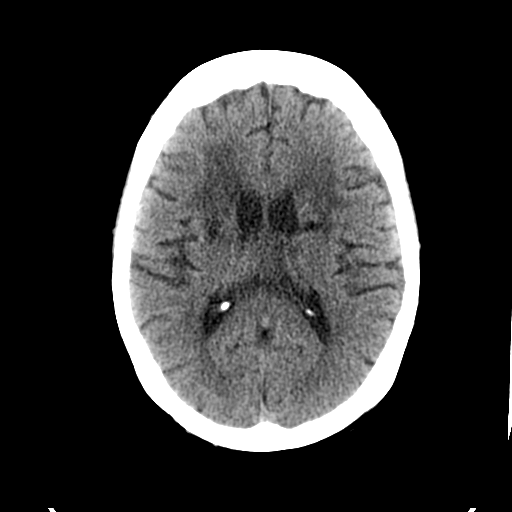
[im 16/30  brain]
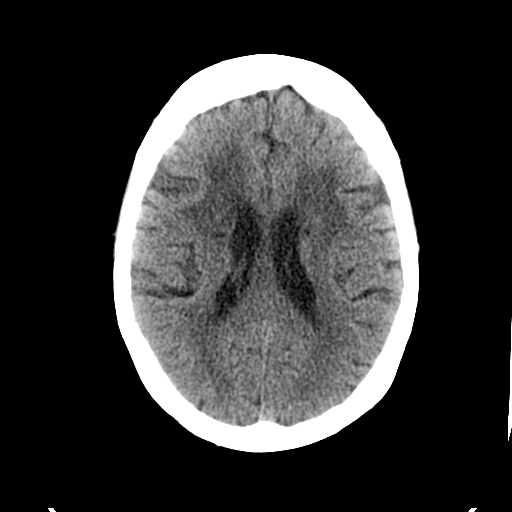
[im 16/30  bone]
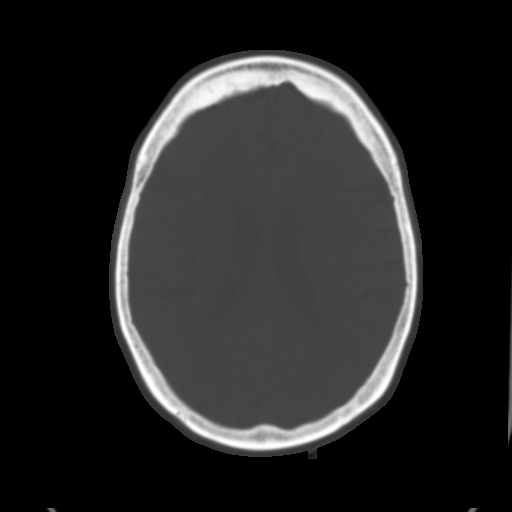
[im 18/30  brain]
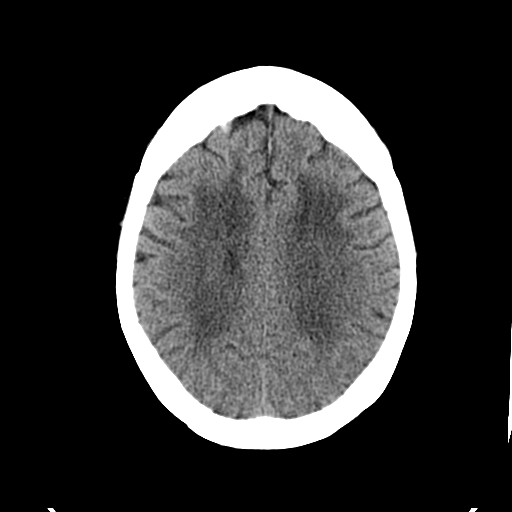
[im 20/30  brain]
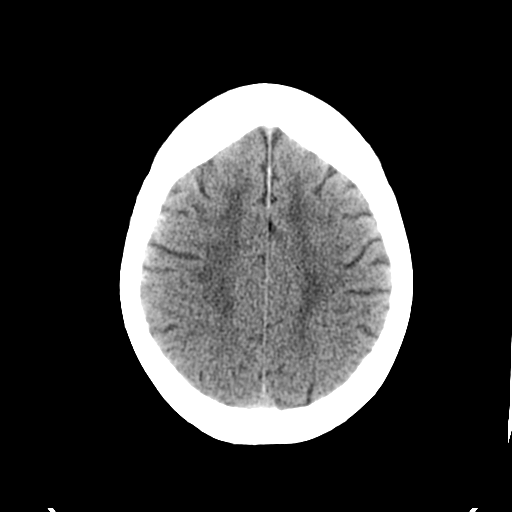
[im 22/30  brain]
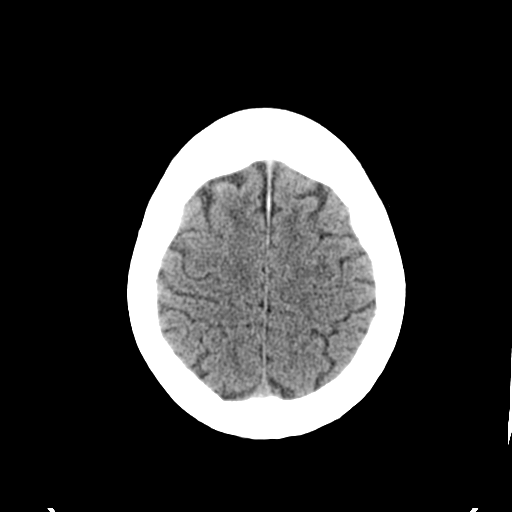
[im 23/30  brain]
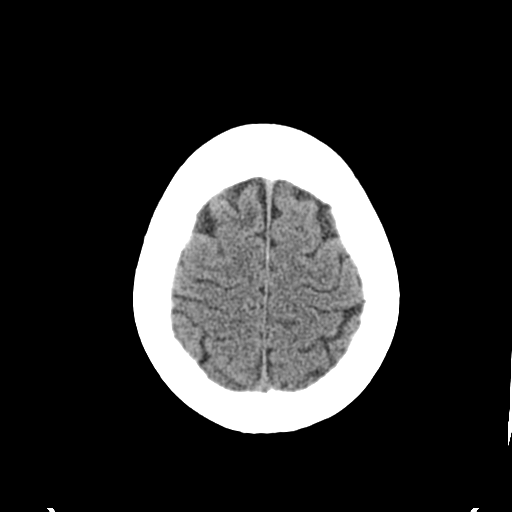
[im 23/30  bone]
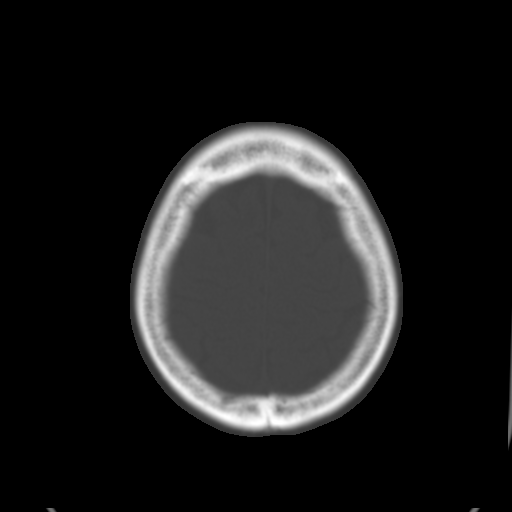
[im 25/30  brain]
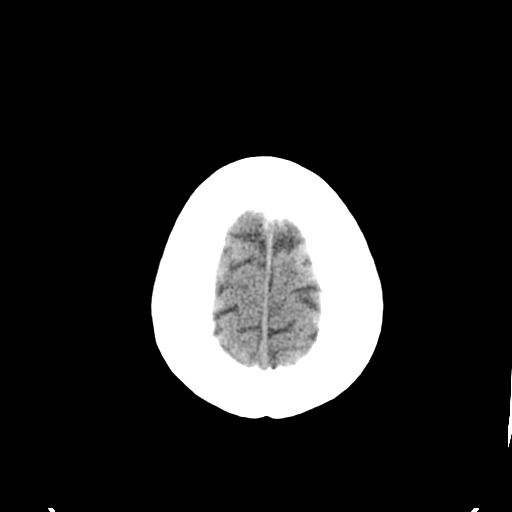
[im 27/30  brain]
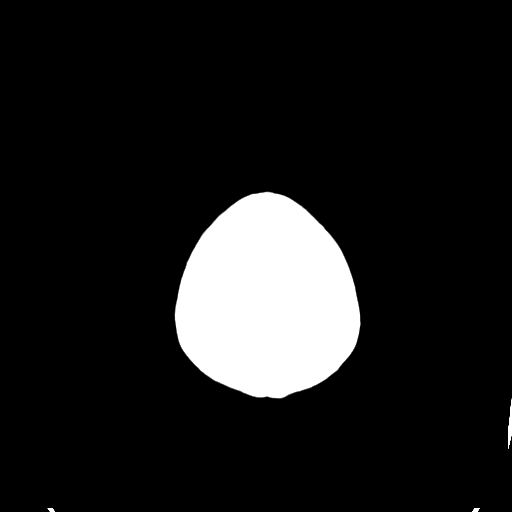
[im 29/30  brain]
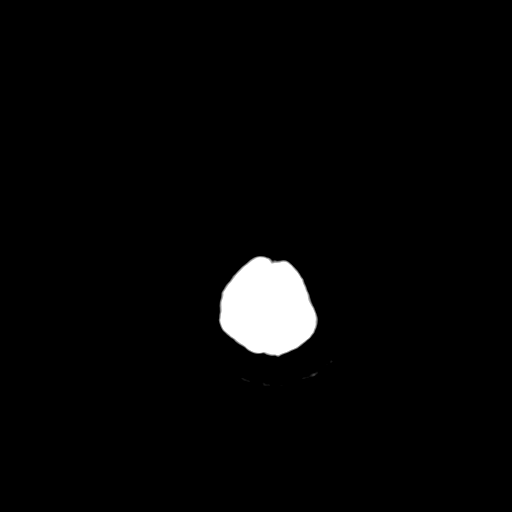

[16 of 30 positions shown; findings below may reference images not displayed]

FINDINGS: Extensive hypoattenuation in the subcortical and
periventricular deep white matter compatible chronic microvascular
ischemic change is again seen.  Bilateral lacunar infarctions in
the basal ganglia again noted.  No evidence of acute abnormality
including acute infarction, hemorrhage, mass lesion, mass effect,
midline shift or abnormal extra-axial fluid collection.  No
hydrocephalus or pneumocephalus.  Minimal mucosal thickening is
seen in the left sphenoid sinus, unchanged.
IMPRESSION: 1.  No acute abnormality.
2.  Extensive chronic microvascular ischemic disease.

## 2012-05-16 IMAGING — CT CT HEAD W/O CM
2 series · 17 of 30 positions shown, 20 images · non-contrast
Comparison: 04/21/2010

CLINICAL DATA: Syncope, dizziness.

CT HEAD WITHOUT CONTRAST
TECHNIQUE: Contiguous axial images were obtained from the base of
the skull through the vertex without contrast.

[Series 2: head w/o · axial · non-contrast · 0.43mm/px · z∈[-112,-7]mm · 9 of 27 slices shown, 12 images]
[im 3/27  brain]
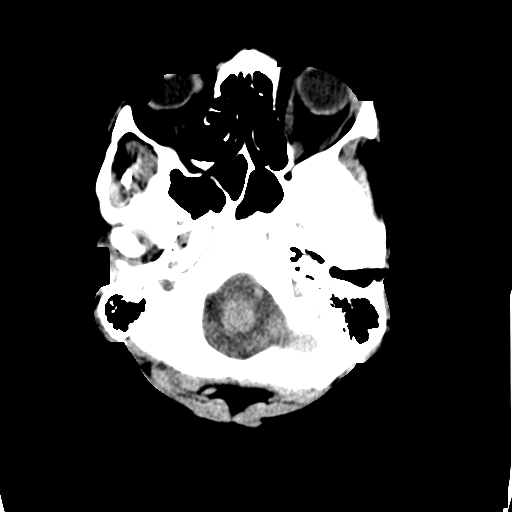
[im 3/27  bone]
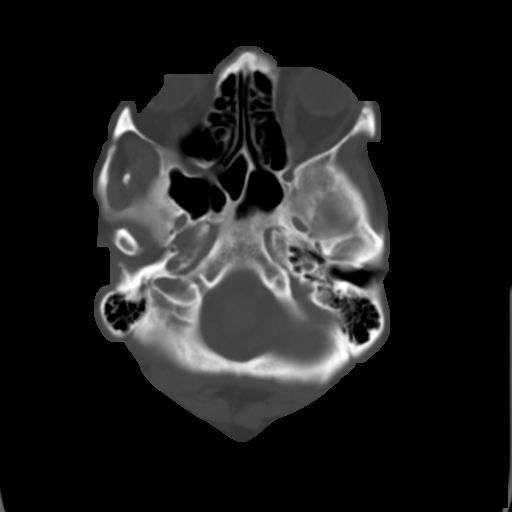
[im 6/27  brain]
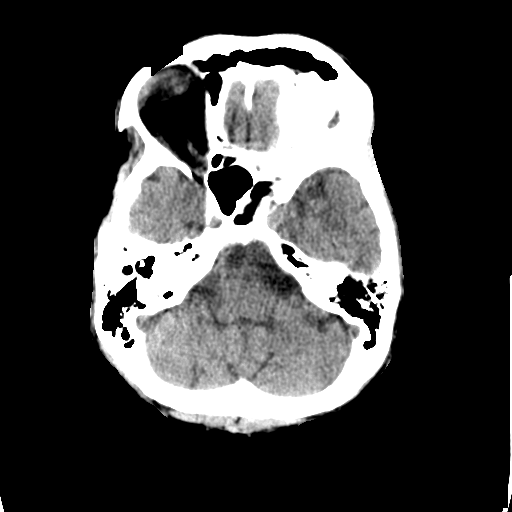
[im 8/27  brain]
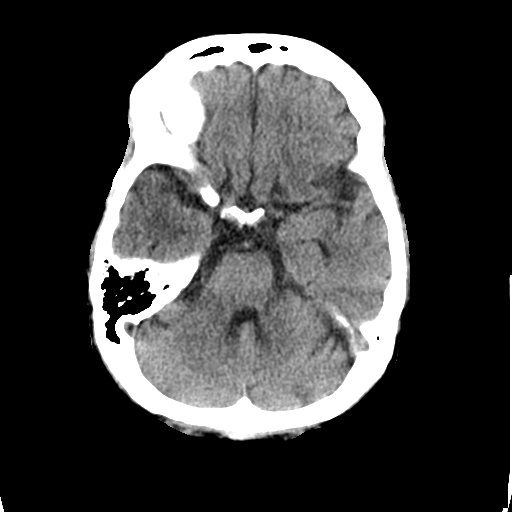
[im 11/27  brain]
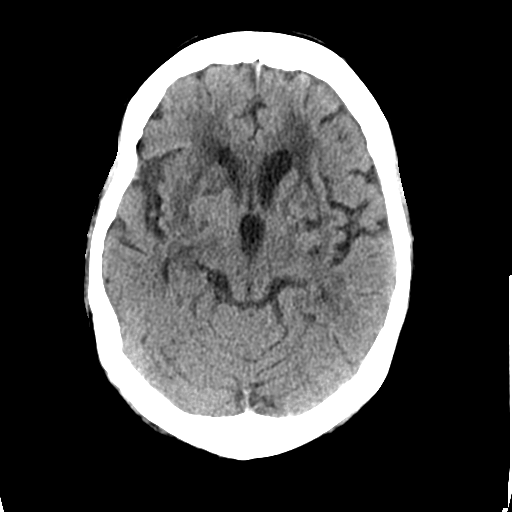
[im 14/27  brain]
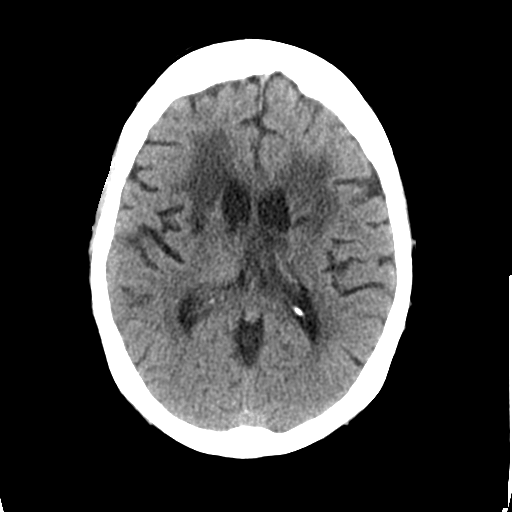
[im 14/27  bone]
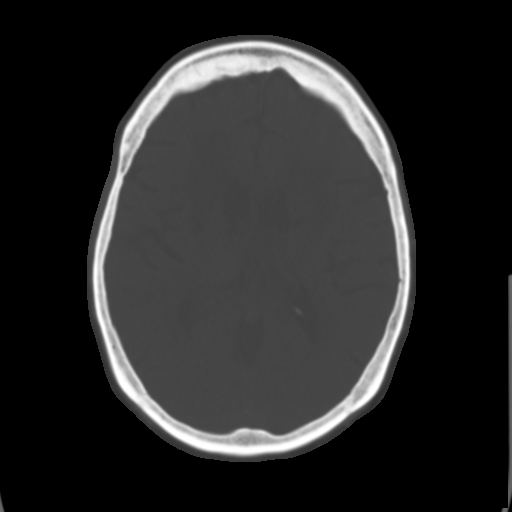
[im 16/27  brain]
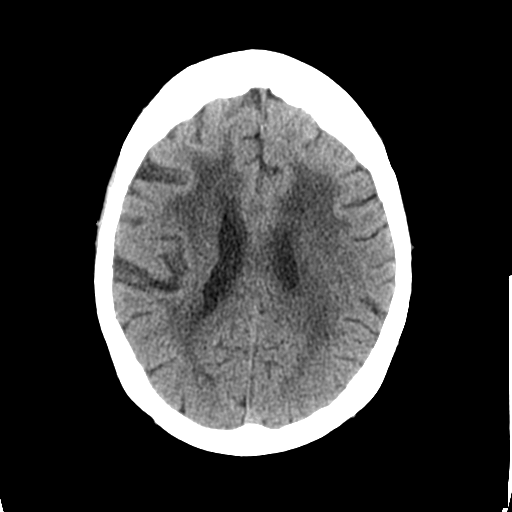
[im 19/27  brain]
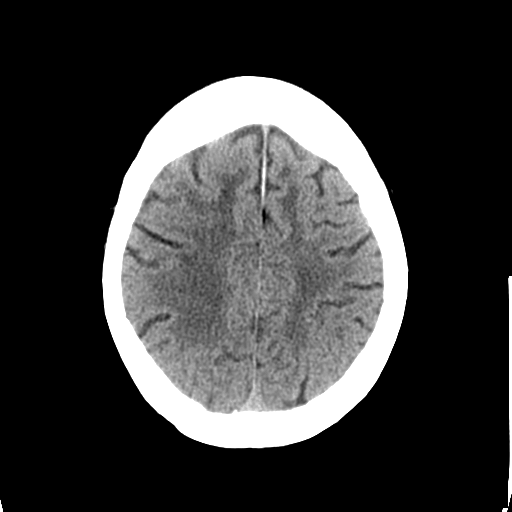
[im 21/27  brain]
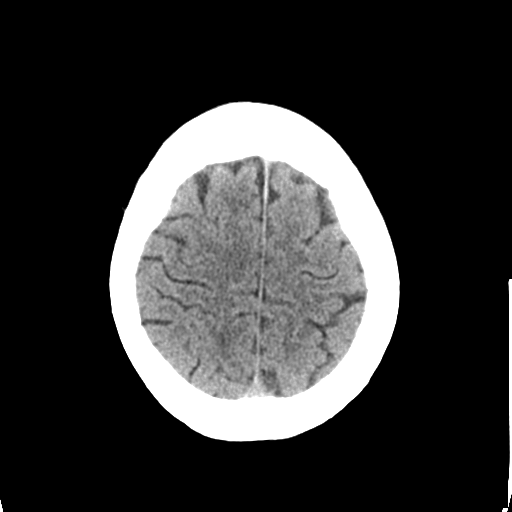
[im 24/27  brain]
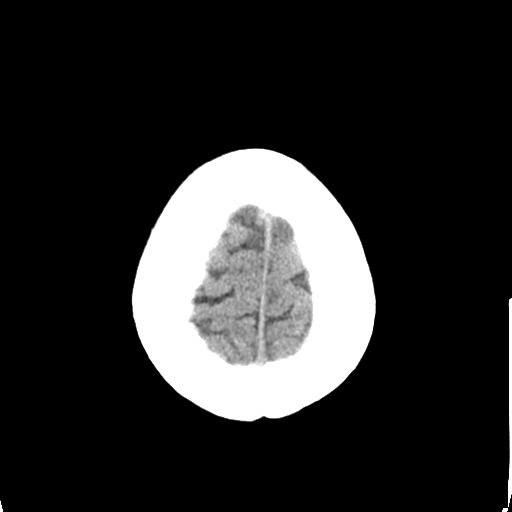
[im 24/27  bone]
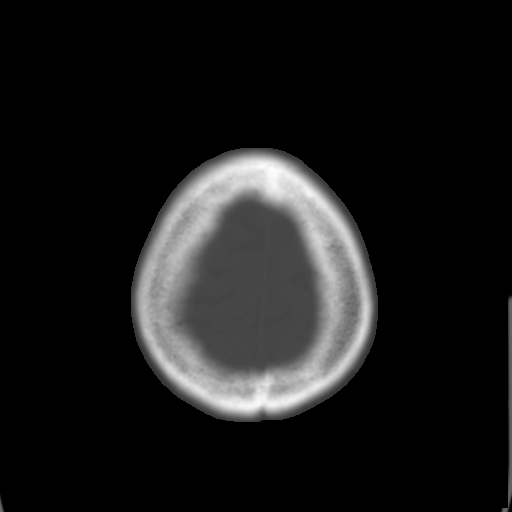

[Series 3: bone window · axial · 0.43mm/px · z∈[-111,-6]mm · 8 of 45 slices shown]
[im 5/45  bone]
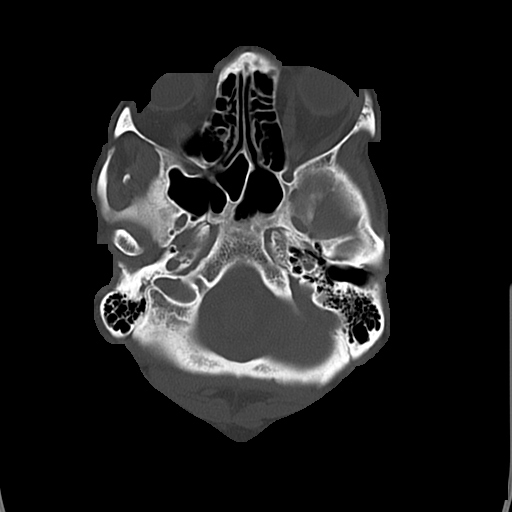
[im 10/45  bone]
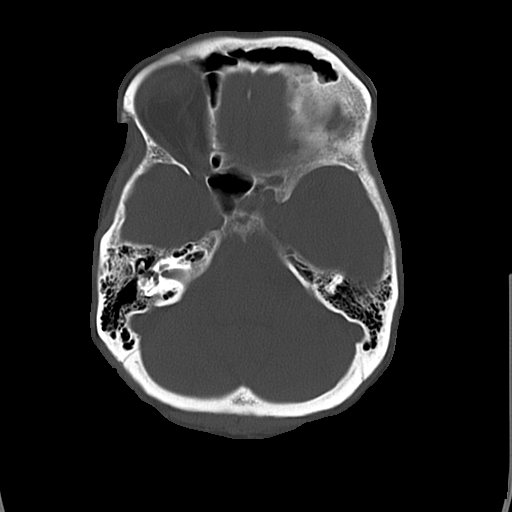
[im 15/45  bone]
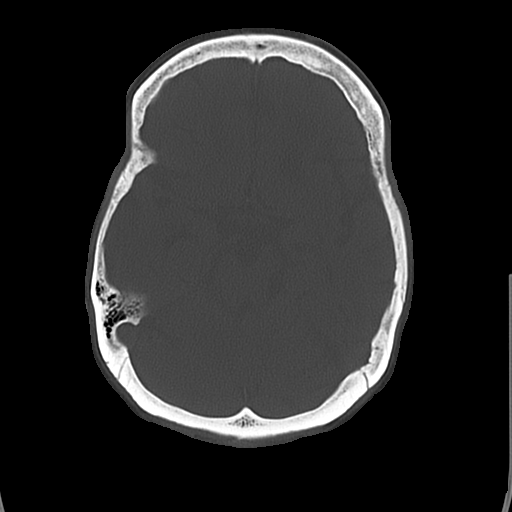
[im 20/45  bone]
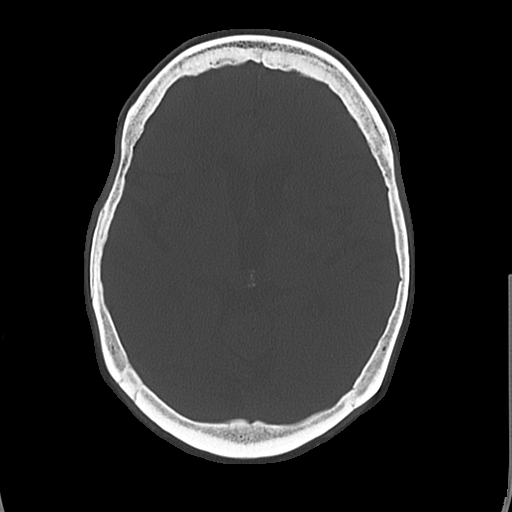
[im 25/45  bone]
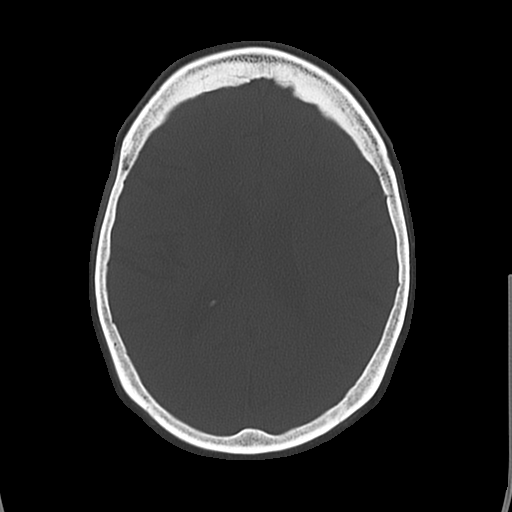
[im 30/45  bone]
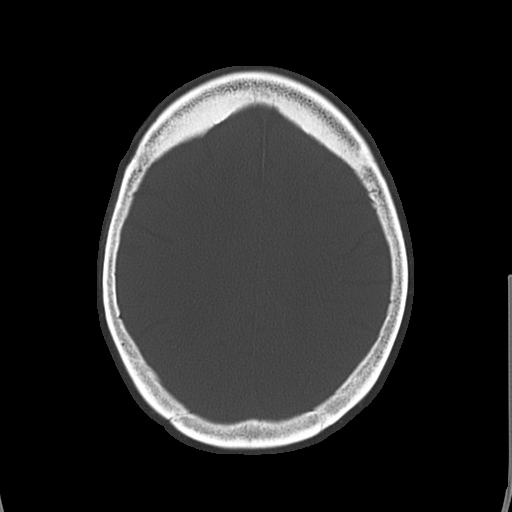
[im 35/45  bone]
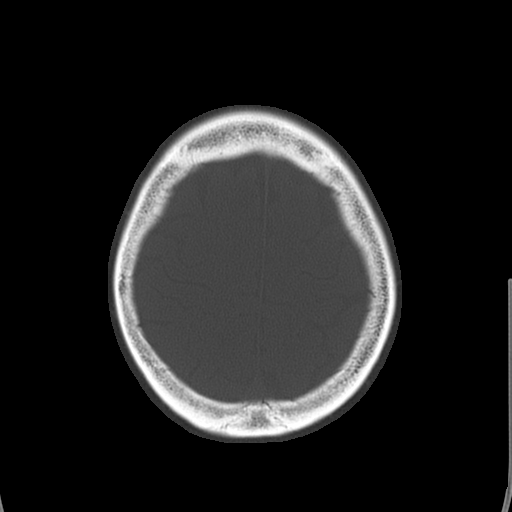
[im 40/45  bone]
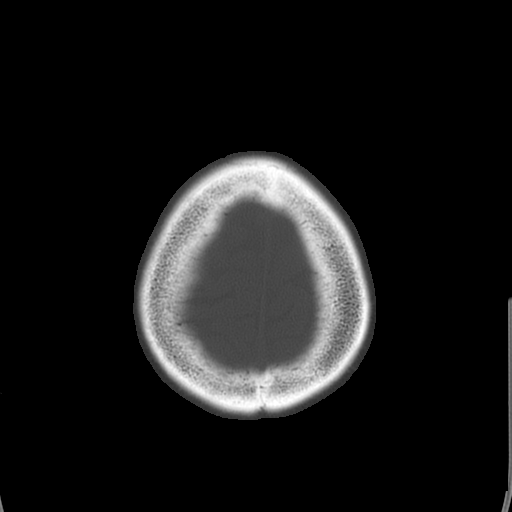

[17 of 30 positions shown; findings below may reference images not displayed]

FINDINGS: There is atrophy and chronic small vessel disease
changes.  Old bilateral basal ganglia lacunar infarcts, stable. No
acute intracranial abnormality.  Specifically, no hemorrhage,
hydrocephalus, mass lesion, acute infarction, or significant
intracranial injury.  No acute calvarial abnormality.

Air-fluid levels in the left sphenoid sinus.  Mastoids are clear as
are the remaining paranasal sinuses.
IMPRESSION: No acute intracranial abnormality.

Atrophy, chronic microvascular disease.

Old bilateral basal ganglia lacunar infarcts.

Air-fluid level in the left sphenoid sinus suggesting acute
sinusitis.

## 2012-08-15 ENCOUNTER — Other Ambulatory Visit (HOSPITAL_COMMUNITY): Payer: Self-pay | Admitting: Family Medicine

## 2012-08-15 DIAGNOSIS — R1310 Dysphagia, unspecified: Secondary | ICD-10-CM

## 2012-08-20 ENCOUNTER — Ambulatory Visit (HOSPITAL_COMMUNITY)
Admission: RE | Admit: 2012-08-20 | Discharge: 2012-08-20 | Disposition: A | Discharge status: Home / Self Care | Payer: Medicare (Managed Care) | Source: Ambulatory Visit | Attending: Family Medicine | Admitting: Family Medicine

## 2012-08-20 DIAGNOSIS — R1319 Other dysphagia: Secondary | ICD-10-CM | POA: Insufficient documentation

## 2012-08-20 DIAGNOSIS — R1312 Dysphagia, oropharyngeal phase: Secondary | ICD-10-CM | POA: Insufficient documentation

## 2012-08-20 DIAGNOSIS — R1310 Dysphagia, unspecified: Secondary | ICD-10-CM

## 2012-08-20 NOTE — Procedures (Signed)
Objective Swallowing Evaluation: Modified Barium Swallowing Study  Patient Details  Name: Traci Tucker MRN: [id number] Date of Birth: 11-03-1945  Today's Date: 08/20/2012 Time: 1100-1140 SLP Time Calculation (min): 40 min  Past Medical History: No past medical history on file. Past Surgical History: No past surgical history on file. HPI:  This 67 year old female resides with her daughter and attends [facility] during the day.  Daughter reports staff was concerned that pt. is aspirating due to coughing during meals.  Daughter also reported that pt. "sneaks" thin liquids from the refridgerator and sink at home.  PMH:  CVA in 2011 with chronic dysphagia; malnutrition; CVD, Caridiovascular disease.     Assessment / Plan / Recommendation Clinical Impression  Clinical impression: Pt. continues to exhibit a chronic moderate oropharyngeal dysphagia, with continued aspiration of thin liquids during the swallow secondary to reduced laryngeal elevation, decreased epiglottic deflection, and inadequate laryngeal closure.  Pt. does have a cough response (mild) with aspiration.  Pt. appears to tolerate nectar thick liquids without aspiration, but may have difficulty if mixing food/liquids.  Pt. intermittently had tongue pumping behavior after chewing a cereal bar and rocked the bolus back and forth anterior to posterior, prior to finally triggering a swallow.  If pt. continues to add more food to mouth prior to swallowing and clearing oral cavity, she may experience more difficulty during meals. Pt. was unable to swallow a whole pill with applesauce.    Treatment Recommendation  Defer treatment plan to SLP at (Comment) (PACES)    Diet Recommendation Dysphagia 2 (Fine chop);Nectar-thick liquid   Liquid Administration via: Cup;No straw Medication Administration: Crushed with puree Supervision: Full supervision/cueing for compensatory strategies Compensations: Slow rate;Small sips/bites;Check for  pocketing Postural Changes and/or Swallow Maneuvers: Out of bed for meals;Seated upright 90 degrees    Other  Recommendations Recommended Consults: OT self-feeding Oral Care Recommendations: Oral care QID;Staff/trained caregiver to provide oral care Other Recommendations: Order thickener from pharmacy;Prohibited food (jello, ice cream, thin soups);Clarify dietary restrictions   Follow Up Recommendations  Home health SLP (PACES SLP)    Frequency and Duration        Pertinent Vitals/Pain n/a    SLP Swallow Goals     General Date of Onset: 04/20/09 HPI: This 67 year old female resides with her daughter and attends PACES during the day.  Daughter reports staff was concerned that pt. is aspirating due to coughing during meals.  Daughter also reported that pt. "sneaks" thin liquids from the refridgerator and sink at home.  PMH:  CVA in 2011 with chronic dysphagia; malnutrition; CVD, Caridiovascular disease. Type of Study: Modified Barium Swallowing Study Reason for Referral: Objectively evaluate swallowing function Previous Swallow Assessment: 03/21/09; 03/27/09; 04/17/09; and 10/23/09 MBSS at [facility].   Diet Prior to this Study: Dysphagia 2 (chopped);Nectar-thick liquids Temperature Spikes Noted: No Respiratory Status: Room air History of Recent Intubation: No Behavior/Cognition: Alert;Cooperative;Requires cueing;Doesn't follow directions;Decreased sustained attention Oral Cavity - Dentition: Edentulous;Dentures, not available (Dentures at home) Self-Feeding Abilities: Needs assist Patient Positioning: Upright in chair Baseline Vocal Quality: Clear Volitional Cough: Weak Volitional Swallow: Unable to elicit Anatomy: Within functional limits Pharyngeal Secretions: Not observed secondary MBS    Reason for Referral Objectively evaluate swallowing function   Oral Phase Oral Preparation/Oral Phase Oral Phase: Impaired Oral - Solids Oral - Mechanical Soft: Impaired mastication;Weak lingual  manipulation;Lingual pumping;Reduced posterior propulsion;Holding of bolus;Delayed oral transit   Pharyngeal Phase Pharyngeal Phase Pharyngeal Phase: Impaired Pharyngeal - Honey Pharyngeal - Honey Teaspoon: Delayed swallow initiation;Premature spillage  to valleculae Pharyngeal - Thin Pharyngeal - Thin Teaspoon: Delayed swallow initiation;Reduced laryngeal elevation;Penetration/Aspiration during swallow;Premature spillage to pyriform sinuses;Premature spillage to valleculae;Compensatory strategies attempted (Comment) (Unable to f/c for compensatory strategies.) Penetration/Aspiration details (thin teaspoon): Material enters airway, passes BELOW cords and not ejected out despite cough attempt by patient  Cervical Esophageal Phase    GO    Cervical Esophageal Phase Cervical Esophageal Phase: [facility]    Functional Assessment Tool Used: Clinical judgement Functional Limitations: Swallowing Swallow Current Status (Z6109): At least 60 percent but less than 80 percent impaired, limited or restricted Swallow Goal Status [phone]): At least 60 percent but less than 80 percent impaired, limited or restricted Swallow Discharge Status [phone]): At least 60 percent but less than 80 percent impaired, limited or restricted    Maryjo Rochester T 08/20/2012, 1:57 PM

## 2013-09-19 ENCOUNTER — Ambulatory Visit
Admission: RE | Admit: 2013-09-19 | Discharge: 2013-09-19 | Disposition: A | Discharge status: Home / Self Care | Payer: Medicare (Managed Care) | Source: Ambulatory Visit | Attending: Family Medicine | Admitting: Family Medicine

## 2013-09-19 ENCOUNTER — Other Ambulatory Visit: Payer: Self-pay | Admitting: Family Medicine

## 2013-09-19 DIAGNOSIS — R059 Cough, unspecified: Secondary | ICD-10-CM

## 2013-09-19 DIAGNOSIS — R05 Cough: Secondary | ICD-10-CM
